# Patient Record
Sex: Male | Born: 1985 | Race: Black or African American | Hispanic: No | Marital: Single | Smoking: Current every day smoker
Health system: Southern US, Community
[De-identification: ages and names within clinical notes are randomized; demographics above are authoritative.]

---

## 2004-07-10 ENCOUNTER — Emergency Department: Payer: Self-pay | Admitting: Emergency Medicine

## 2004-07-19 ENCOUNTER — Emergency Department: Payer: Self-pay | Admitting: Emergency Medicine

## 2008-07-12 ENCOUNTER — Emergency Department: Payer: Self-pay | Admitting: Emergency Medicine

## 2008-08-30 ENCOUNTER — Emergency Department: Payer: Self-pay | Admitting: Emergency Medicine

## 2010-01-20 ENCOUNTER — Emergency Department: Payer: Self-pay | Admitting: Emergency Medicine

## 2010-03-03 ENCOUNTER — Emergency Department: Payer: Self-pay | Admitting: Emergency Medicine

## 2010-03-03 IMAGING — CR DG CHEST 2V
1 series · 3 of 3 positions shown · non-contrast
Comparison: none

REASON FOR EXAM: chest pain
COMMENTS:   LMP: (Male)

PROCEDURE:     DXR - DXR CHEST PA (OR AP) AND LATERAL  - [DATE] [DATE]
RESULT:     The lungs are clear. The cardiac silhouette and visualized bony
skeleton are unremarkable.

[Series 1: view not recorded · 0.17mm/px · 3 of 3 slices shown]
[im 1/3]
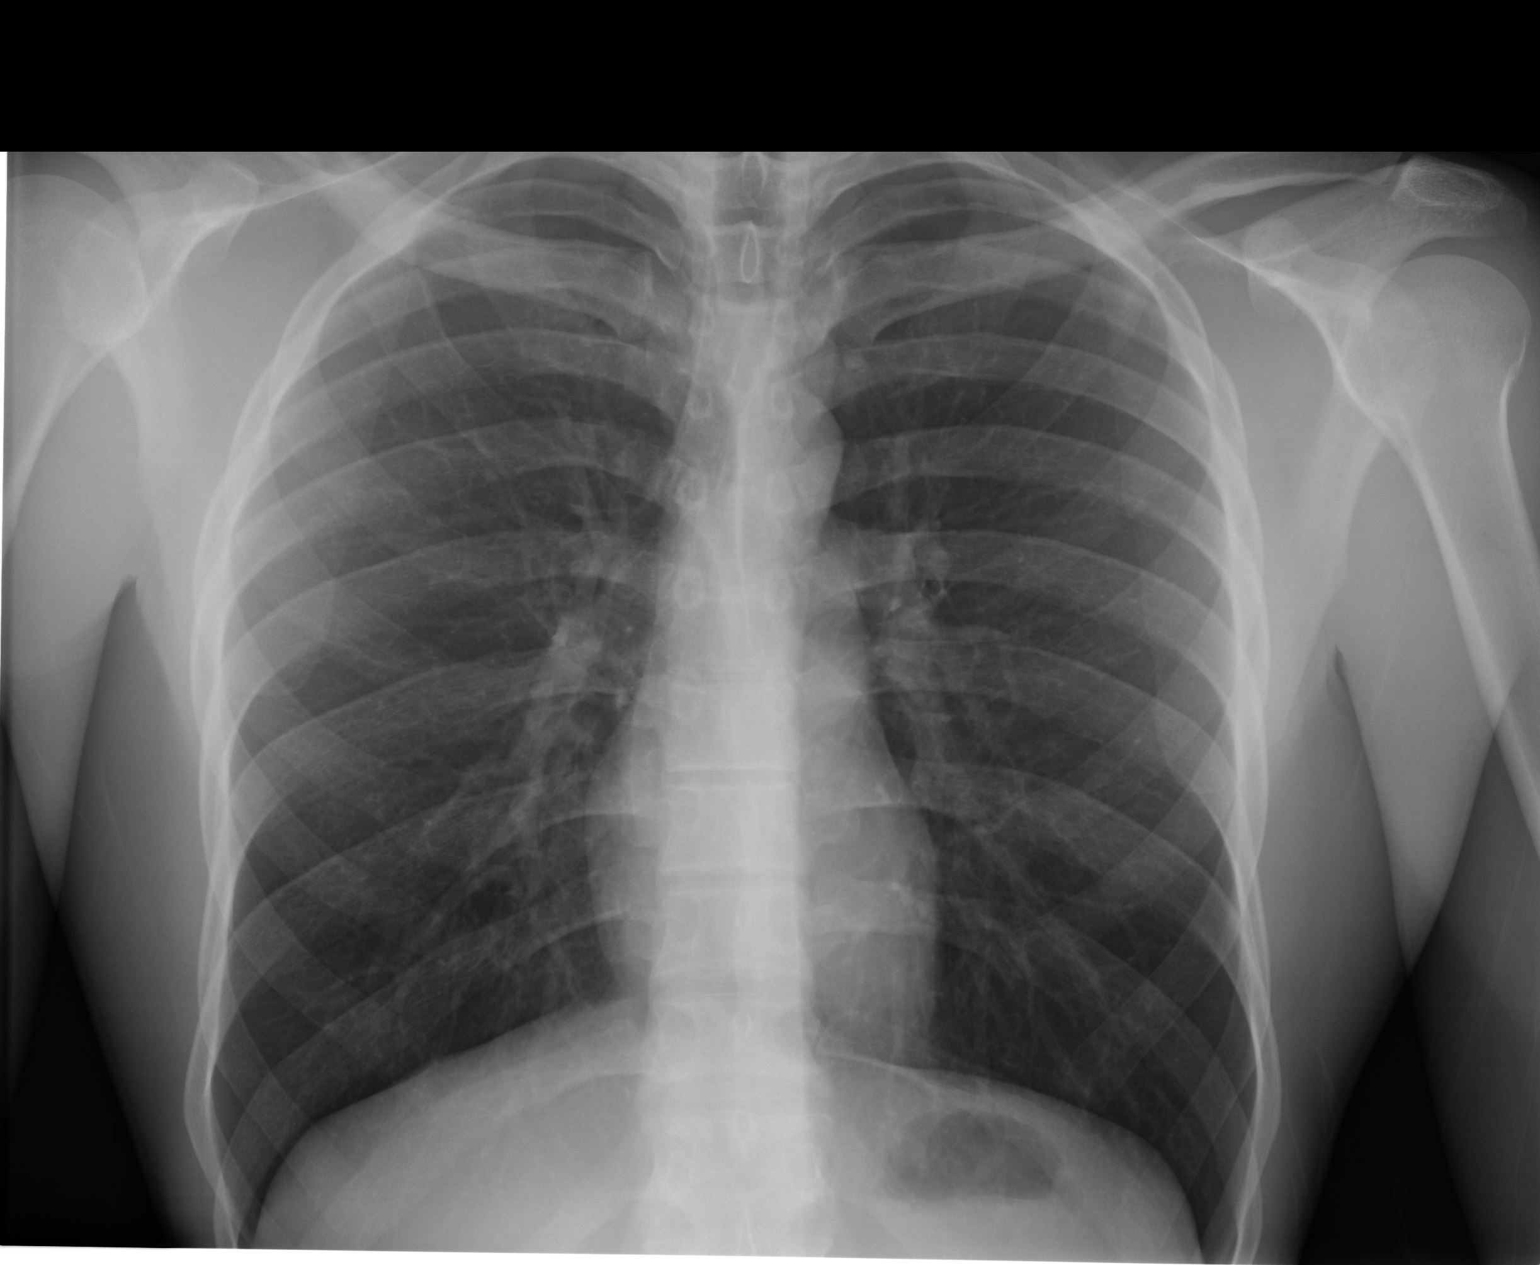
[im 2/3]
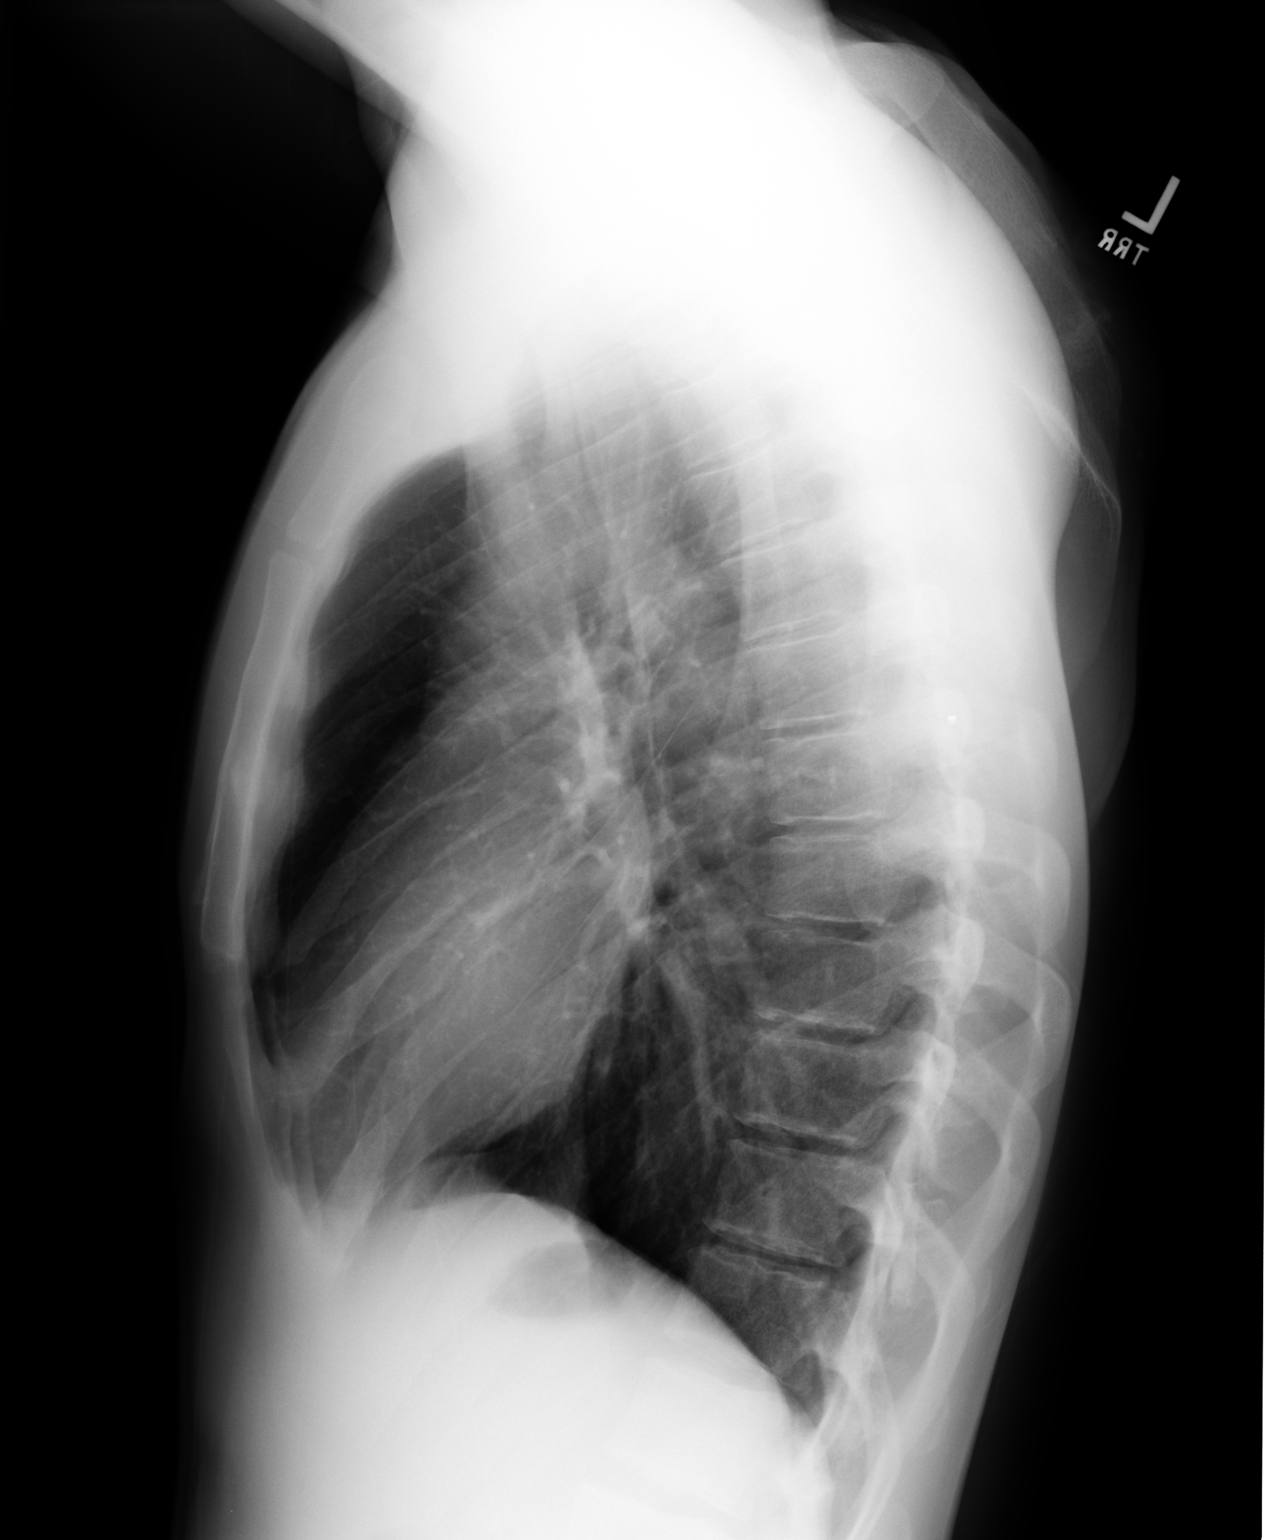
[im 3/3]
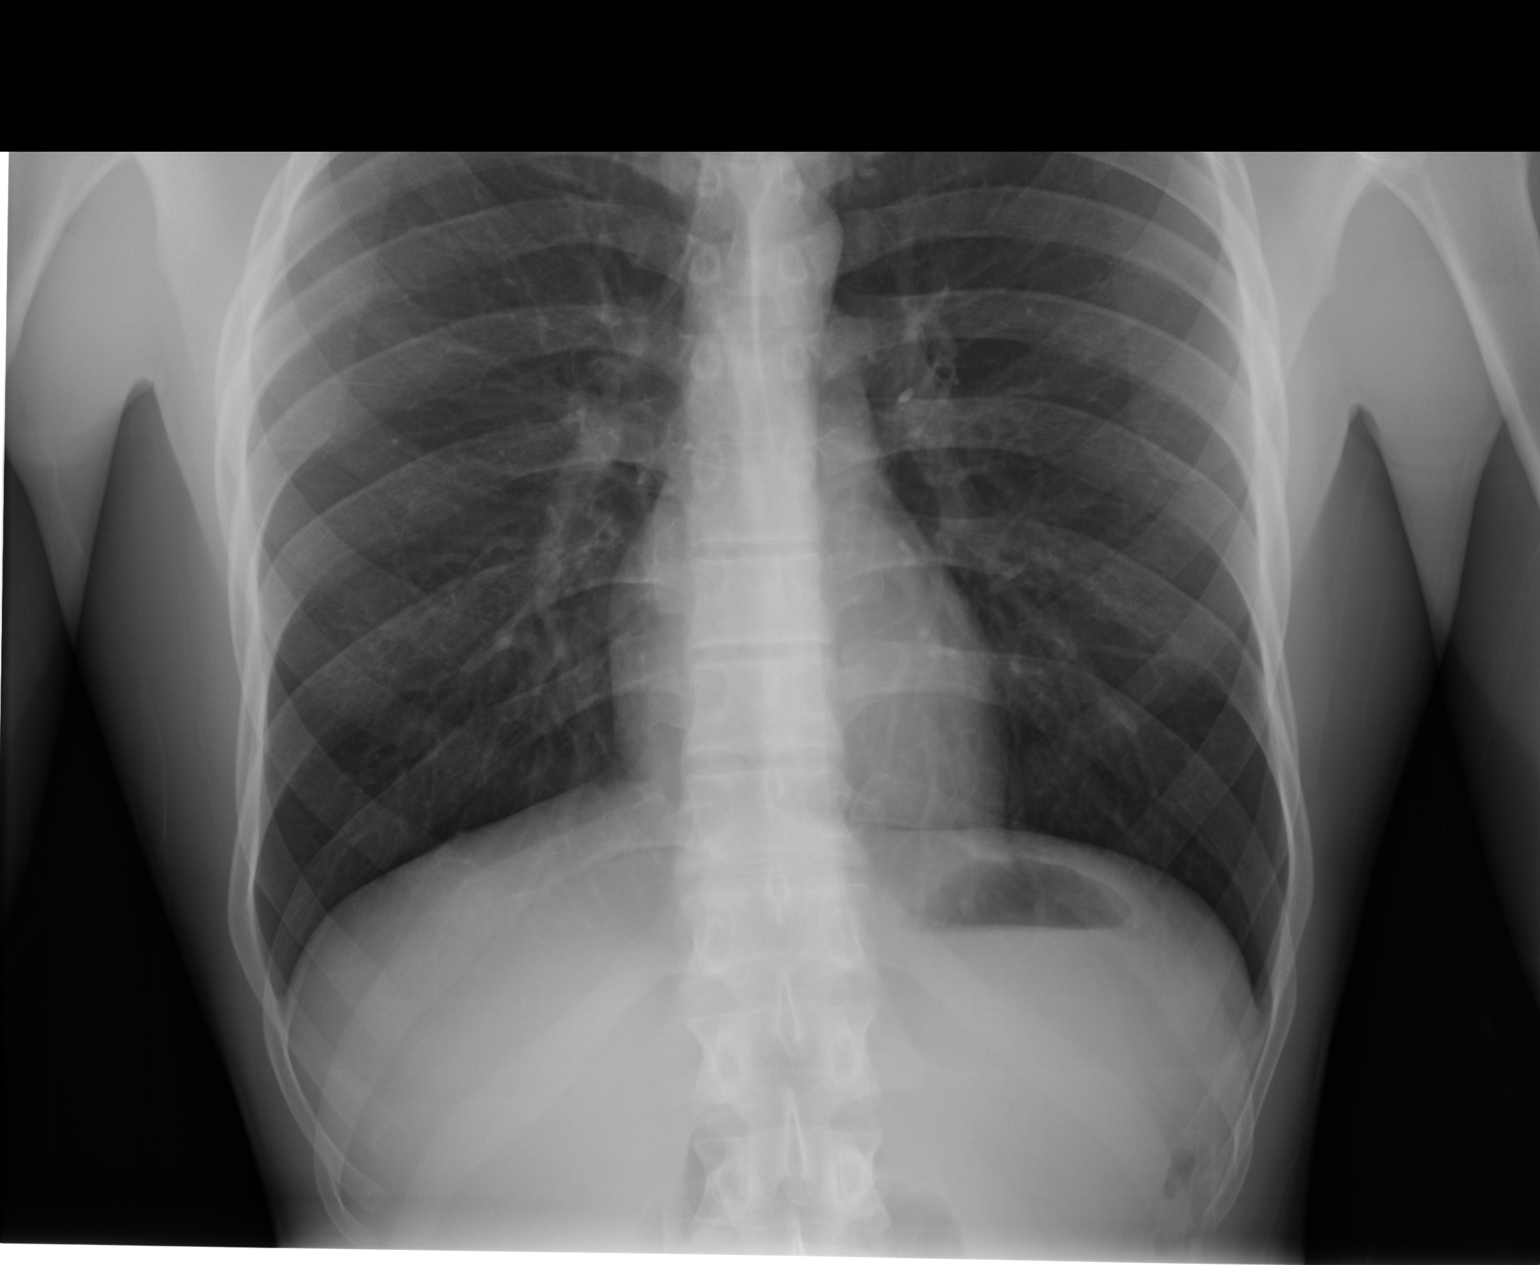

[3 of 3 positions shown; findings below may reference images not displayed]

IMPRESSION: 1. Chest radiograph without evidence of acute cardiopulmonary disease.

## 2010-08-12 ENCOUNTER — Emergency Department: Payer: Self-pay | Admitting: Emergency Medicine

## 2010-09-21 ENCOUNTER — Emergency Department: Payer: Self-pay | Admitting: Emergency Medicine

## 2011-04-01 ENCOUNTER — Emergency Department: Payer: Self-pay | Admitting: Emergency Medicine

## 2011-04-01 LAB — COMPREHENSIVE METABOLIC PANEL
Albumin: 4.7 g/dL (ref 3.4–5.0)
Alkaline Phosphatase: 50 U/L (ref 50–136)
BUN: 12 mg/dL (ref 7–18)
Bilirubin,Total: 0.6 mg/dL (ref 0.2–1.0)
Glucose: 81 mg/dL (ref 65–99)
Osmolality: 280 (ref 275–301)
SGPT (ALT): 28 U/L
Sodium: 141 mmol/L (ref 136–145)
Total Protein: 8.7 g/dL — ABNORMAL HIGH (ref 6.4–8.2)

## 2011-04-01 LAB — CBC
HGB: 16.2 g/dL (ref 13.0–18.0)
MCH: 30.1 pg (ref 26.0–34.0)
MCV: 90 fL (ref 80–100)
Platelet: 178 10*3/uL (ref 150–440)
RBC: 5.37 10*6/uL (ref 4.40–5.90)

## 2011-04-01 LAB — LIPASE, BLOOD: Lipase: 52 U/L — ABNORMAL LOW (ref 73–393)

## 2011-09-16 ENCOUNTER — Emergency Department: Payer: Self-pay | Admitting: *Deleted

## 2012-11-19 ENCOUNTER — Emergency Department: Payer: Self-pay | Admitting: Emergency Medicine

## 2012-12-17 ENCOUNTER — Emergency Department: Payer: Self-pay | Admitting: Emergency Medicine

## 2012-12-17 LAB — CBC
HCT: 46.5 % (ref 40.0–52.0)
HGB: 15.7 g/dL (ref 13.0–18.0)
MCH: 29.3 pg (ref 26.0–34.0)

## 2012-12-17 LAB — URINALYSIS, COMPLETE
Bilirubin,UR: NEGATIVE
Blood: NEGATIVE
Ph: 7 (ref 4.5–8.0)
Protein: 30
RBC,UR: 1 /HPF (ref 0–5)
Specific Gravity: 1.021 (ref 1.003–1.030)

## 2012-12-17 LAB — COMPREHENSIVE METABOLIC PANEL
Alkaline Phosphatase: 71 U/L (ref 50–136)
Bilirubin,Total: 0.4 mg/dL (ref 0.2–1.0)
Calcium, Total: 9.8 mg/dL (ref 8.5–10.1)
Chloride: 108 mmol/L — ABNORMAL HIGH (ref 98–107)
Co2: 26 mmol/L (ref 21–32)
Creatinine: 1.52 mg/dL — ABNORMAL HIGH (ref 0.60–1.30)
EGFR (Non-African Amer.): >60
Glucose: 102 mg/dL — ABNORMAL HIGH (ref 65–99)
SGOT(AST): 32 U/L (ref 15–37)
SGPT (ALT): 31 U/L (ref 12–78)
Sodium: 139 mmol/L (ref 136–145)

## 2012-12-17 LAB — LIPASE, BLOOD: Lipase: 80 U/L (ref 73–393)

## 2013-02-05 ENCOUNTER — Emergency Department: Payer: Self-pay | Admitting: Emergency Medicine

## 2013-03-26 ENCOUNTER — Emergency Department: Payer: Self-pay | Admitting: Emergency Medicine

## 2013-03-26 LAB — CBC WITH DIFFERENTIAL/PLATELET
BASOS ABS: 0 10*3/uL (ref 0.0–0.1)
Basophil %: 0.3 %
Eosinophil #: 0 10*3/uL (ref 0.0–0.7)
Eosinophil %: 0.1 %
HCT: 47.2 % (ref 40.0–52.0)
HGB: 15.9 g/dL (ref 13.0–18.0)
LYMPHS PCT: 16 %
Lymphocyte #: 1.9 10*3/uL (ref 1.0–3.6)
MCH: 29.7 pg (ref 26.0–34.0)
MCHC: 33.7 g/dL (ref 32.0–36.0)
MCV: 88 fL (ref 80–100)
Monocyte #: 1.2 x10 3/mm — ABNORMAL HIGH (ref 0.2–1.0)
Monocyte %: 9.8 %
NEUTROS ABS: 8.8 10*3/uL — AB (ref 1.4–6.5)
NEUTROS PCT: 73.8 %
Platelet: 201 10*3/uL (ref 150–440)
RBC: 5.35 10*6/uL (ref 4.40–5.90)
RDW: 14.4 % (ref 11.5–14.5)
WBC: 11.9 10*3/uL — AB (ref 3.8–10.6)

## 2013-03-26 LAB — TROPONIN I

## 2013-03-26 LAB — COMPREHENSIVE METABOLIC PANEL
Albumin: 4.2 g/dL (ref 3.4–5.0)
Alkaline Phosphatase: 55 U/L
Anion Gap: 5 — ABNORMAL LOW (ref 7–16)
BILIRUBIN TOTAL: 0.4 mg/dL (ref 0.2–1.0)
BUN: 17 mg/dL (ref 7–18)
Calcium, Total: 9.4 mg/dL (ref 8.5–10.1)
Chloride: 103 mmol/L (ref 98–107)
Co2: 26 mmol/L (ref 21–32)
Creatinine: 1.43 mg/dL — ABNORMAL HIGH (ref 0.60–1.30)
EGFR (Non-African Amer.): >60
GLUCOSE: 91 mg/dL (ref 65–99)
OSMOLALITY: 269 (ref 275–301)
Potassium: 3.8 mmol/L (ref 3.5–5.1)
SGOT(AST): 31 U/L (ref 15–37)
SGPT (ALT): 26 U/L (ref 12–78)
SODIUM: 134 mmol/L — AB (ref 136–145)
Total Protein: 8.4 g/dL — ABNORMAL HIGH (ref 6.4–8.2)

## 2013-03-26 LAB — DRUG SCREEN, URINE
Amphetamines, Ur Screen: NEGATIVE (ref ?–1000)
BARBITURATES, UR SCREEN: NEGATIVE (ref ?–200)
Benzodiazepine, Ur Scrn: NEGATIVE (ref ?–200)
Cannabinoid 50 Ng, Ur ~~LOC~~: POSITIVE (ref ?–50)
Cocaine Metabolite,Ur ~~LOC~~: POSITIVE (ref ?–300)
MDMA (Ecstasy)Ur Screen: NEGATIVE (ref ?–500)
Methadone, Ur Screen: NEGATIVE (ref ?–300)
Opiate, Ur Screen: NEGATIVE (ref ?–300)
PHENCYCLIDINE (PCP) UR S: NEGATIVE (ref ?–25)
Tricyclic, Ur Screen: NEGATIVE (ref ?–1000)

## 2013-03-26 LAB — URINALYSIS, COMPLETE
Bacteria: NONE SEEN
Glucose,UR: NEGATIVE mg/dL (ref 0–75)
LEUKOCYTE ESTERASE: NEGATIVE
Nitrite: NEGATIVE
PH: 5 (ref 4.5–8.0)
Protein: 100
RBC,UR: 13 /HPF (ref 0–5)
SQUAMOUS EPITHELIAL: NONE SEEN
Specific Gravity: 1.034 (ref 1.003–1.030)
WBC UR: 3 /HPF (ref 0–5)

## 2013-03-26 LAB — LIPASE, BLOOD: Lipase: 86 U/L (ref 73–393)

## 2016-07-10 ENCOUNTER — Emergency Department
Admission: EM | Admit: 2016-07-10 | Discharge: 2016-07-10 | Disposition: A | Discharge status: Home / Self Care | Payer: 59 | Attending: Student in an Organized Health Care Education/Training Program | Admitting: Student in an Organized Health Care Education/Training Program

## 2016-07-10 ENCOUNTER — Encounter: Payer: Self-pay | Admitting: Emergency Medicine

## 2016-07-10 DIAGNOSIS — R21 Rash and other nonspecific skin eruption: Secondary | ICD-10-CM | POA: Diagnosis present

## 2016-07-10 DIAGNOSIS — T782XXA Anaphylactic shock, unspecified, initial encounter: Secondary | ICD-10-CM | POA: Diagnosis not present

## 2016-07-10 DIAGNOSIS — F17210 Nicotine dependence, cigarettes, uncomplicated: Secondary | ICD-10-CM | POA: Diagnosis not present

## 2016-07-10 MED ORDER — EPINEPHRINE 0.3 MG/0.3ML IJ SOAJ
INTRAMUSCULAR | Status: AC
  Administered 2016-07-10: 0.3 mg via INTRAMUSCULAR
  Filled 2016-07-10: qty 0.3

## 2016-07-10 MED ORDER — DIPHENHYDRAMINE HCL 50 MG/ML IJ SOLN
50.0000 mg | Freq: Once | INTRAMUSCULAR | Status: AC
Start: 2016-07-10 — End: 2016-07-10
  Administered 2016-07-10: 50 mg via INTRAVENOUS
  Filled 2016-07-10: qty 1

## 2016-07-10 MED ORDER — METHYLPREDNISOLONE SODIUM SUCC 125 MG IJ SOLR
125.0000 mg | Freq: Once | INTRAMUSCULAR | Status: AC
  Administered 2016-07-10: 125 mg via INTRAVENOUS
  Filled 2016-07-10: qty 2

## 2016-07-10 MED ORDER — EPINEPHRINE 0.3 MG/0.3ML IJ SOAJ
0.3000 mg | Freq: Once | INTRAMUSCULAR | Status: AC
  Administered 2016-07-10: 0.3 mg via INTRAMUSCULAR

## 2016-07-10 MED ORDER — SODIUM CHLORIDE 0.9 % IV BOLUS (SEPSIS)
500.0000 mL | Freq: Once | INTRAVENOUS | Status: AC
  Administered 2016-07-10: 500 mL via INTRAVENOUS

## 2016-07-10 MED ORDER — PREDNISONE 20 MG PO TABS: 40.0000 mg | ORAL_TABLET | Freq: Every day | ORAL | 0 refills | Status: AC

## 2016-07-10 MED ORDER — EPINEPHRINE 0.3 MG/0.3ML IJ SOAJ: 0.3000 mg | Freq: Once | INTRAMUSCULAR | 0 refills | Status: AC

## 2016-07-10 MED ORDER — FAMOTIDINE IN NACL 20-0.9 MG/50ML-% IV SOLN
20.0000 mg | Freq: Once | INTRAVENOUS | Status: AC
  Administered 2016-07-10: 20 mg via INTRAVENOUS
  Filled 2016-07-10: qty 50

## 2016-07-10 NOTE — ED Triage Notes (Signed)
Patient presents to the ED with hives, itching and redness to face, torso, arms, and legs.  Patient also has some swelling to his lips.  Patient states he found a red ant on his scrotum prior to itching starting.  Patient appears uncomfortable in triage.  Denies any difficulty talking or swallowing or breathing.  Tongue appears slightly swollen.

## 2016-07-10 NOTE — ED Provider Notes (Signed)
Casey Casey [name] Provider Note    First MD Initiated Contact with Casey [name] 07/10/16 1647     (approximate)  I have reviewed the triage vital signs and the nursing notes.   HISTORY  Chief Complaint Allergic Reaction    HPI Casey Redmanuel Tripodi Jr. is a 31 y.o. male sensitivity complaint of diffuse itching rash eruption with swelling of his lips and left side of his tongue that occurred roughly 2 hours prior to arrival. Casey [name] states he was at work and felt something bite of the left side of his testicle. He looked down and found a small red ant. He smashed and threw it out but started breaking out in diffuse hives. Denies any history of allergic reaction   History reviewed. No pertinent past medical history. FMH: no bleeding disorders History reviewed. No pertinent surgical history. There are no active problems to display for this Casey [name].     Prior to Admission medications   Not on File    Allergies Casey [name] has no known allergies.    Social History Social History  Substance Use Topics  . Smoking status: Current Every Day Smoker    Packs/day: 1.00    Types: Cigarettes  . Smokeless tobacco: Never Used  . Alcohol use No    Review of Systems Casey [name] denies headaches, rhinorrhea, blurry vision, numbness, shortness of breath, chest pain, edema, cough, abdominal pain, nausea, vomiting, diarrhea, dysuria, fevers, rashes or hallucinations unless otherwise stated above in HPI. ____________________________________________   PHYSICAL EXAM:  VITAL SIGNS: Vitals:   07/10/16 1634  BP: (!) 127/99  Resp: 18    Constitutional: Alert and oriented. Well appearing and in no acute distress. Eyes: Conjunctivae are normal. PERRL. EOMI. Head: Atraumatic. Nose: No congestion/rhinnorhea. Mouth/Throat: Mucous membranes are moist.  Oropharynx non-erythematous.  + left sided tongue swelling, + uvular edema, normal phonation, urticaria surrounding  lips without significant angioedema Neck: No stridor. Painless ROM. No cervical spine tenderness to palpation Hematological/Lymphatic/Immunilogical: No cervical lymphadenopathy. Cardiovascular: Normal rate, regular rhythm. Grossly normal heart sounds.  Good peripheral circulation. Respiratory: Normal respiratory effort.  No retractions. Lungs CTAB. Gastrointestinal: Soft and nontender. No distention. No abdominal bruits. No CVA tenderness. Musculoskeletal: No lower extremity tenderness nor edema.  No joint effusions. Neurologic:  Normal speech and language. No gross focal neurologic deficits are appreciated. No gait instability. Skin:  Diffuse urtial erruptions involing BUE and BLE Psychiatric: Mood and affect are normal. Speech and behavior are normal.  ____________________________________________   LABS (all labs ordered are listed, but only abnormal results are displayed)  No results found for this or any previous visit (from the past 24 hour(s)). ____________________________________________ ___________________________________________   PROCEDURES  Procedure(s) performed:  Procedures    Critical Care performed: no ____________________________________________   INITIAL IMPRESSION / ASSESSMENT AND PLAN / ED COURSE  Pertinent labs & imaging results that were available during my care of the Casey [name] were reviewed by me and considered in my medical decision making (see chart for details).  DDX: anaphylaxis, allergic reaction, urticaria, angioedema  Casey Redmanuel Elahi Jr. is a 31 y.o. who presents to the ED with allergic reaction with face swelling and tongue swelling as described above. Casey [name] otherwise well-appearing and without any respiratory distress but clinical findings are consistent with anaphylaxis. Casey [name] will be given IM epinephrine, steroids, Benadryl and Pepcid.  Will continue to monitor.  Clinical Course as of Jul 11 1934  Tue Jul 10, 2016  1721 Casey [name] reassessed.  Feels that the itching has improved. No worsening  swelling of his tongue or lips.  [PR]  1907 Casey [name] reassessed. Tongue swelling is resolved. No uvular edema. No urticaria. Resting comfortably.  [PR]    Clinical Course User Index [PR] Willy Eddy, MD     ____________________________________________   FINAL CLINICAL IMPRESSION(S) / ED DIAGNOSES  Final diagnoses:  Anaphylaxis, initial encounter      NEW MEDICATIONS STARTED DURING THIS VISIT:  New Prescriptions   No medications on file     Note:  This document was prepared using Dragon voice recognition software and may include unintentional dictation errors.    Willy Eddy, MD 07/10/16 [phone]

## 2016-07-10 NOTE — ED Notes (Signed)
FN: pt got bit by red ant around 130pm today and now having facial swelling and itching. Pt states got bit in his private area. Pt noted to have redness to face an neck with some swelling to lips. No resp distress noted.

## 2018-12-29 ENCOUNTER — Other Ambulatory Visit: Payer: Self-pay

## 2018-12-29 DIAGNOSIS — S90851A Superficial foreign body, right foot, initial encounter: Secondary | ICD-10-CM | POA: Insufficient documentation

## 2018-12-29 DIAGNOSIS — S80862A Insect bite (nonvenomous), left lower leg, initial encounter: Secondary | ICD-10-CM | POA: Insufficient documentation

## 2018-12-29 DIAGNOSIS — S40862A Insect bite (nonvenomous) of left upper arm, initial encounter: Secondary | ICD-10-CM | POA: Insufficient documentation

## 2018-12-29 DIAGNOSIS — Y999 Unspecified external cause status: Secondary | ICD-10-CM | POA: Insufficient documentation

## 2018-12-29 DIAGNOSIS — Y939 Activity, unspecified: Secondary | ICD-10-CM | POA: Insufficient documentation

## 2018-12-29 DIAGNOSIS — W57XXXA Bitten or stung by nonvenomous insect and other nonvenomous arthropods, initial encounter: Secondary | ICD-10-CM | POA: Insufficient documentation

## 2018-12-29 DIAGNOSIS — F17210 Nicotine dependence, cigarettes, uncomplicated: Secondary | ICD-10-CM | POA: Insufficient documentation

## 2018-12-29 DIAGNOSIS — Y929 Unspecified place or not applicable: Secondary | ICD-10-CM | POA: Insufficient documentation

## 2018-12-29 DIAGNOSIS — S90562A Insect bite (nonvenomous), left ankle, initial encounter: Secondary | ICD-10-CM | POA: Insufficient documentation

## 2018-12-29 NOTE — ED Triage Notes (Signed)
Pt to the er for an allergic reaction to fire ants. Areas of itching are located on the left ankle and left arm. Bites occurred on Saturday. Pt states the itching has not improved.

## 2018-12-30 ENCOUNTER — Emergency Department
Admission: EM | Admit: 2018-12-30 | Discharge: 2018-12-30 | Disposition: A | Discharge status: Home / Self Care | Payer: 59 | Attending: Emergency Medicine | Admitting: Emergency Medicine

## 2018-12-30 DIAGNOSIS — S90851A Superficial foreign body, right foot, initial encounter: Secondary | ICD-10-CM

## 2018-12-30 DIAGNOSIS — W57XXXA Bitten or stung by nonvenomous insect and other nonvenomous arthropods, initial encounter: Secondary | ICD-10-CM

## 2018-12-30 MED ORDER — HYDROXYZINE HCL 25 MG PO TABS
25.0000 mg | ORAL_TABLET | Freq: Once | ORAL | Status: AC
  Administered 2018-12-30: 25 mg via ORAL
  Filled 2018-12-30: qty 1

## 2018-12-30 MED ORDER — DEXAMETHASONE SODIUM PHOSPHATE 10 MG/ML IJ SOLN
10.0000 mg | Freq: Once | INTRAMUSCULAR | Status: AC
Start: 2018-12-30 — End: 2018-12-30
  Administered 2018-12-30: 10 mg via INTRAMUSCULAR
  Filled 2018-12-30: qty 1

## 2018-12-30 MED ORDER — HYDROXYZINE HCL 25 MG PO TABS: 25.0000 mg | ORAL_TABLET | Freq: Three times a day (TID) | ORAL | 0 refills | Status: DC | PRN

## 2018-12-30 NOTE — ED Provider Notes (Signed)
[facility] Emergency Department Provider Note  ____________________________________________  Time seen: Approximately 12:42 AM  I have reviewed the triage vital signs and the nursing notes.   HISTORY  Chief Complaint Allergic Reaction   HPI Casey [name]. is a 33 y.o. male presenting to the emergency department for treatment and evaluation of itching secondary to fire ant bites.  He states that 2 days ago, he noticed the ants crawling on his left leg and started smacking at them.  He then also sustained some bites to the left upper arm.  He has taken some Benadryl without much relief.  Patient also states that he has a piece of glass embedded in the sole of his right foot that has been there for "a long time."  He would like to know what to do about that.   History reviewed. No pertinent past medical history.  There are no active problems to display for this patient.   History reviewed. No pertinent surgical history.  Prior to Admission medications   Medication Sig Start Date End Date Taking? Authorizing Provider  hydrOXYzine (ATARAX/VISTARIL) 25 MG tablet Take 1 tablet (25 mg total) by mouth 3 (three) times daily as needed. 12/30/18   Chinita Pester, FNP    Allergies Patient has no known allergies.  No family history on file.  Social History Social History   Tobacco Use  . Smoking status: Current Every Day Smoker    Packs/day: 1.00    Types: Cigarettes  . Smokeless tobacco: Never Used  Substance Use Topics  . Alcohol use: No  . Drug use: No    Review of Systems  Constitutional: Negative for fever. Respiratory: Negative for cough or shortness of breath.  Musculoskeletal: Negative for myalgias Skin: Positive for insect bites Neurological: Negative for numbness or paresthesias. ____________________________________________   PHYSICAL EXAM:  VITAL SIGNS: ED Triage Vitals  Enc Vitals Group     BP --      Pulse --      Resp --     Temp --      Temp src --      SpO2 --      Weight 12/29/18 2340 150 lb (68 kg)     Height 12/29/18 2340 5\' 11"  (1.803 m)     Head Circumference --      Peak Flow --      Pain Score 12/29/18 2339 0     Pain Loc --      Pain Edu? --      Excl. in GC? --      Constitutional: Well appearing. Eyes: Conjunctivae are clear without discharge or drainage. Nose: No rhinorrhea noted. Mouth/Throat: Airway is patent.  Neck: No stridor. Unrestricted range of motion observed. Cardiovascular: Capillary refill is <3 seconds.  Respiratory: Respirations are even and unlabored.. Musculoskeletal: Unrestricted range of motion observed. Neurologic: Awake, alert, and oriented x 4.  Skin: Scattered, macular, erythematous and excoriated areas to the left ankle, left upper leg, and left upper extremity.  No secondary infection noted.  Right plantar surface with a palpable, mobile, foreign body without any open wounds or lesions.  ____________________________________________   LABS (all labs ordered are listed, but only abnormal results are displayed)  Labs Reviewed - No data to display ____________________________________________  EKG  Not indicated. ____________________________________________  RADIOLOGY  Not indicated ____________________________________________   PROCEDURES  Procedures ____________________________________________   INITIAL IMPRESSION / ASSESSMENT AND PLAN / ED COURSE  Casey [name]. is a 33 y.o.  male presenting to the emergency department for treatment and evaluation after having fire ants bite him 2 days ago.  He would also like to know what to do about a retained foreign body in the right foot.  Exam is reassuring.  No appearance of cellulitis or secondary infection to the areas of excoriation due to fire ant bites.  While here, he was given an injection of Decadron and will receive prescription for Atarax.  For the retained foreign body in the foot, he was  referred to podiatry for evaluation.  Patient is to return to the emergency department for symptoms of change or worsen if he is unable to schedule an appointment with primary care.   Medications  dexamethasone (DECADRON) injection 10 mg (10 mg Intramuscular Given 12/30/18 0041)  hydrOXYzine (ATARAX/VISTARIL) tablet 25 mg (25 mg Oral Given 12/30/18 0041)     Pertinent labs & imaging results that were available during my care of the patient were reviewed by me and considered in my medical decision making (see chart for details).  ____________________________________________   FINAL CLINICAL IMPRESSION(S) / ED DIAGNOSES  Final diagnoses:  Insect bites and stings, initial encounter  Foreign body of skin of plantar aspect of right foot    ED Discharge Orders         Ordered    hydrOXYzine (ATARAX/VISTARIL) 25 MG tablet  3 times daily PRN     12/30/18 0026           Note:  This document was prepared using Dragon voice recognition software and may include unintentional dictation errors.   Victorino Dike, FNP 12/30/18 5809    Nena Polio, MD 12/30/18 [phone]

## 2018-12-30 NOTE — Discharge Instructions (Signed)
Take the atarax as prescribed as needed for itching.  Follow up with podiatry for evaluation and potential removal of glass from your foot.

## 2019-04-20 ENCOUNTER — Ambulatory Visit (INDEPENDENT_AMBULATORY_CARE_PROVIDER_SITE_OTHER): Payer: Self-pay | Admitting: Podiatry

## 2019-04-20 ENCOUNTER — Ambulatory Visit (INDEPENDENT_AMBULATORY_CARE_PROVIDER_SITE_OTHER): Payer: Self-pay

## 2019-04-20 ENCOUNTER — Encounter: Payer: Self-pay | Admitting: Podiatry

## 2019-04-20 ENCOUNTER — Other Ambulatory Visit: Payer: Self-pay

## 2019-04-20 DIAGNOSIS — M79671 Pain in right foot: Secondary | ICD-10-CM

## 2019-04-20 DIAGNOSIS — S90851A Superficial foreign body, right foot, initial encounter: Secondary | ICD-10-CM

## 2019-04-22 ENCOUNTER — Encounter: Payer: Self-pay | Admitting: Podiatry
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
## 2019-06-05 ENCOUNTER — Other Ambulatory Visit: Payer: Self-pay

## 2019-06-05 ENCOUNTER — Ambulatory Visit: Payer: Self-pay | Admitting: Physician Assistant

## 2019-06-05 DIAGNOSIS — A5401 Gonococcal cystitis and urethritis, unspecified: Secondary | ICD-10-CM

## 2019-06-05 DIAGNOSIS — Z113 Encounter for screening for infections with a predominantly sexual mode of transmission: Secondary | ICD-10-CM

## 2019-06-05 LAB — GRAM STAIN

## 2019-06-05 MED ORDER — DOXYCYCLINE HYCLATE 100 MG PO TABS: 100.0000 mg | ORAL_TABLET | Freq: Two times a day (BID) | ORAL | 0 refills | Status: AC

## 2019-06-05 MED ORDER — CEFTRIAXONE SODIUM 250 MG IJ SOLR
500.0000 mg | Freq: Once | INTRAMUSCULAR | Status: AC
  Administered 2019-06-05: 500 mg via INTRAMUSCULAR

## 2019-06-05 NOTE — Progress Notes (Signed)
Gram stain reviewed, pt treated for GC per provider orders. Provider orders completed. 

## 2019-06-08 ENCOUNTER — Encounter: Payer: Self-pay | Admitting: Physician Assistant

## 2019-06-08 NOTE — Progress Notes (Signed)
   [facility] STI clinic/screening visit  Subjective:  Casey [name]. is a 34 y.o. male being seen today for an STI screening visit. The patient reports they do have symptoms.    Patient has the following medical conditions:  There are no problems to display for this patient.    Chief Complaint  Patient presents with  . SEXUALLY TRANSMITTED DISEASE    STD screening (no bloodwork)    HPI  Patient reports that he has had clear/white discharge for 2 days and dysuria for about 5 days.  Denies any other symptoms.   See flowsheet for further details and programmatic requirements.    The following portions of the patient's history were reviewed and updated as appropriate: allergies, current medications, past medical history, past social history, past surgical history and problem list.  Objective:  There were no vitals filed for this visit.  Physical Exam Constitutional:      General: He is not in acute distress.    Appearance: Normal appearance.  HENT:     Head: Normocephalic and atraumatic.     Comments: No nits, lice or hair loss. No cervical, supraclavicular or axillary adenopathy.    Mouth/Throat:     Mouth: Mucous membranes are moist.     Pharynx: Oropharynx is clear. No oropharyngeal exudate or posterior oropharyngeal erythema.  Eyes:     Conjunctiva/sclera: Conjunctivae normal.  Pulmonary:     Effort: Pulmonary effort is normal.  Abdominal:     Palpations: Abdomen is soft. There is no mass.     Tenderness: There is no abdominal tenderness. There is no guarding or rebound.  Genitourinary:    Penis: Normal.      Testes: Normal.     Comments: Pubic area without nits, lice, edema, erythema, or inguinal adenopathy. Penis circumcised, without rash or lesions. Small amount of whitish discharge at meatus. Musculoskeletal:     Cervical back: Neck supple. No tenderness.  Skin:    General: Skin is warm and dry.     Findings: No bruising,  erythema, lesion or rash.  Neurological:     Mental Status: He is alert and oriented to person, place, and time.  Psychiatric:        Mood and Affect: Mood normal.        Behavior: Behavior normal.        Thought Content: Thought content normal.        Judgment: Judgment normal.       Assessment and Plan:  Sian Joles. is a 34 y.o. male presenting to the [facility] for STI screening  1. Screening for STD (sexually transmitted disease) Patient into clinic with symptoms.  Declines blood work today. Rec condoms with all sex. - Gram stain  2. Gonococcal urethritis in male Treat for GC and to cover for Chlamydia with Ceftriaxone 500mg  IM and Doxycycline 100mg  #14 1 po BID for 7 days. No sex for 14 days and until after partner completes treatment. Call with any questions or concerns. - cefTRIAXone (ROCEPHIN) injection 500 mg - doxycycline (VIBRA-TABS) 100 MG tablet; Take 1 tablet (100 mg total) by mouth 2 (two) times daily for 7 days.  Dispense: 14 tablet; Refill: 0     Return in about 3 months (around 09/04/2019) for TOC.  No future appointments.  , PA

## 2019-07-05 ENCOUNTER — Emergency Department: Payer: Self-pay

## 2019-07-05 ENCOUNTER — Encounter: Payer: Self-pay | Admitting: Radiology

## 2019-07-05 ENCOUNTER — Emergency Department
Admission: EM | Admit: 2019-07-05 | Discharge: 2019-07-05 | Disposition: A | Discharge status: Home / Self Care | Payer: Self-pay | Attending: Emergency Medicine | Admitting: Emergency Medicine

## 2019-07-05 ENCOUNTER — Other Ambulatory Visit: Payer: Self-pay

## 2019-07-05 DIAGNOSIS — R112 Nausea with vomiting, unspecified: Secondary | ICD-10-CM

## 2019-07-05 DIAGNOSIS — F17210 Nicotine dependence, cigarettes, uncomplicated: Secondary | ICD-10-CM | POA: Insufficient documentation

## 2019-07-05 DIAGNOSIS — A059 Bacterial foodborne intoxication, unspecified: Secondary | ICD-10-CM | POA: Insufficient documentation

## 2019-07-05 DIAGNOSIS — Z20822 Contact with and (suspected) exposure to covid-19: Secondary | ICD-10-CM | POA: Insufficient documentation

## 2019-07-05 DIAGNOSIS — R1013 Epigastric pain: Secondary | ICD-10-CM | POA: Insufficient documentation

## 2019-07-05 LAB — URINALYSIS, COMPLETE (UACMP) WITH MICROSCOPIC
Bacteria, UA: NONE SEEN
Bilirubin Urine: NEGATIVE
Glucose, UA: NEGATIVE mg/dL
Hgb urine dipstick: NEGATIVE
Ketones, ur: 20 mg/dL — AB
Leukocytes,Ua: NEGATIVE
Nitrite: NEGATIVE
Protein, ur: NEGATIVE mg/dL
Specific Gravity, Urine: 1.04 — ABNORMAL HIGH (ref 1.005–1.030)
Squamous Epithelial / LPF: NONE SEEN (ref 0–5)
pH: 7 (ref 5.0–8.0)

## 2019-07-05 LAB — COMPREHENSIVE METABOLIC PANEL
ALT: 17 U/L (ref 0–44)
AST: 24 U/L (ref 15–41)
Albumin: 4.6 g/dL (ref 3.5–5.0)
Alkaline Phosphatase: 63 U/L (ref 38–126)
Anion gap: 12 (ref 5–15)
BUN: 12 mg/dL (ref 6–20)
CO2: 26 mmol/L (ref 22–32)
Calcium: 9.4 mg/dL (ref 8.9–10.3)
Chloride: 105 mmol/L (ref 98–111)
Creatinine, Ser: 1.19 mg/dL (ref 0.61–1.24)
GFR calc Af Amer: >60 mL/min (ref >60)
GFR calc non Af Amer: >60 mL/min (ref >60)
Glucose, Bld: 87 mg/dL (ref 70–99)
Potassium: 4 mmol/L (ref 3.5–5.1)
Sodium: 143 mmol/L (ref 135–145)
Total Bilirubin: 0.7 mg/dL (ref 0.3–1.2)
Total Protein: 8.2 g/dL — ABNORMAL HIGH (ref 6.5–8.1)

## 2019-07-05 LAB — CBC
HCT: 52.1 % — ABNORMAL HIGH (ref 39.0–52.0)
Hemoglobin: 17.2 g/dL — ABNORMAL HIGH (ref 13.0–17.0)
MCH: 29.9 pg (ref 26.0–34.0)
MCHC: 33 g/dL (ref 30.0–36.0)
MCV: 90.5 fL (ref 80.0–100.0)
Platelets: 217 10*3/uL (ref 150–400)
RBC: 5.76 MIL/uL (ref 4.22–5.81)
RDW: 14.2 % (ref 11.5–15.5)
WBC: 14.4 10*3/uL — ABNORMAL HIGH (ref 4.0–10.5)
nRBC: 0 % (ref 0.0–0.2)

## 2019-07-05 LAB — LIPASE, BLOOD: Lipase: 23 U/L (ref 11–51)

## 2019-07-05 LAB — SARS CORONAVIRUS 2 BY RT PCR (HOSPITAL ORDER, PERFORMED IN ~~LOC~~ HOSPITAL LAB): SARS Coronavirus 2: NEGATIVE

## 2019-07-05 MED ORDER — SODIUM CHLORIDE 0.9 % IV SOLN: Freq: Once | INTRAVENOUS | Status: AC

## 2019-07-05 MED ORDER — SODIUM CHLORIDE 0.9 % IV BOLUS
1000.0000 mL | Freq: Once | INTRAVENOUS | Status: AC
  Administered 2019-07-05: 1000 mL via INTRAVENOUS

## 2019-07-05 MED ORDER — SODIUM CHLORIDE 0.9% FLUSH: 3.0000 mL | Freq: Once | INTRAVENOUS | Status: DC

## 2019-07-05 MED ORDER — ONDANSETRON HCL 4 MG/2ML IJ SOLN
4.0000 mg | Freq: Once | INTRAMUSCULAR | Status: AC | PRN
  Administered 2019-07-05: 4 mg via INTRAVENOUS
  Filled 2019-07-05 (×2): qty 2

## 2019-07-05 MED ORDER — ONDANSETRON 4 MG PO TBDP: 4.0000 mg | ORAL_TABLET | Freq: Three times a day (TID) | ORAL | 0 refills | Status: AC | PRN

## 2019-07-05 MED ORDER — IOHEXOL 300 MG/ML  SOLN
100.0000 mL | Freq: Once | INTRAMUSCULAR | Status: AC | PRN
  Administered 2019-07-05: 100 mL via INTRAVENOUS

## 2019-07-05 MED ORDER — ONDANSETRON HCL 4 MG/2ML IJ SOLN
4.0000 mg | Freq: Once | INTRAMUSCULAR | Status: AC
  Administered 2019-07-05: 4 mg via INTRAVENOUS
  Filled 2019-07-05: qty 2

## 2019-07-05 NOTE — ED Notes (Signed)
Pt given urinal and instructed on use for UA

## 2019-07-05 NOTE — ED Notes (Signed)
Strong odor of marijuana noted in room . Pt resting in bed.

## 2019-07-05 NOTE — ED Notes (Signed)
Pt with CT. 

## 2019-07-05 NOTE — ED Provider Notes (Signed)
Casey [name]  ____________________________________________  Time seen: Approximately 12:08 PM  I have reviewed the triage vital signs and the nursing notes.   HISTORY  Chief Complaint Emesis    HPI Casey Colomb. is a 34 y.o. male who presents emergency department complaining of nausea, vomiting, epigastric abdominal pain, diarrhea, cough and shortness of breath.  Patient states that he has had a cough for "a few days."  Patient states that he was overall feeling well and ate cookout late last night.  Patient states that he had chili, hamburger and then went to bed.  Patient states that this morning he woke up with significant nausea.  He has been having vomiting since he awoke.  Patient states that initial vomiting resulted in him throwing up "everything I ate last night."  Patient states that since then he has been throwing up bile and "spit."  Patient denies any fevers or chills, nasal congestion, sore throat.  Patient does have a cough, mild shortness of breath, epigastric abdominal pain.  No lower abdominal pain.  No urinary complaints.  No medications prior to arrival.      During interview, patient coughing and intermittently vomiting.  Patient has had antiemetics and is still having repetitive bilious vomit.   History reviewed. No pertinent past medical history.  There are no problems to display for this patient.   No past surgical history on file.  Prior to Admission medications   Medication Sig Start Date End Date Taking? Authorizing Provider  hydrOXYzine (ATARAX/VISTARIL) 25 MG tablet Take 1 tablet (25 mg total) by mouth 3 (three) times daily as needed. 12/30/18   Triplett, Rulon Eisenmenger B, FNP  ondansetron (ZOFRAN-ODT) 4 MG disintegrating tablet Take 1 tablet (4 mg total) by mouth every 8 (eight) hours as needed for nausea or vomiting. 07/05/19   Brittie Whisnant, Delorise Royals, PA-C    Allergies Patient has no known  allergies.  No family history on file.  Social History Social History   Tobacco Use  . Smoking status: Current Every Day Smoker    Packs/day: 1.00    Types: Cigarettes  . Smokeless tobacco: Never Used  Substance Use Topics  . Alcohol use: No  . Drug use: No     Review of Systems  Constitutional: No fever/chills Eyes: No visual changes. No discharge ENT: No upper respiratory complaints. Cardiovascular: no chest pain. Respiratory: no cough. No SOB. Gastrointestinal: Epigastric abdominal pain.  Positive for nausea, vomiting, diarrhea.  No constipation. Genitourinary: Negative for dysuria. No hematuria Musculoskeletal: Negative for musculoskeletal pain. Skin: Negative for rash, abrasions, lacerations, ecchymosis. Neurological: Negative for headaches, focal weakness or numbness. 10-point ROS otherwise negative.  ____________________________________________   PHYSICAL EXAM:  VITAL SIGNS: ED Triage Vitals  Enc Vitals Group     BP 07/05/19 1200 123/82     Pulse Rate 07/05/19 1200 61     Resp 07/05/19 1200 18     Temp 07/05/19 1200 98.4 F (36.9 C)     Temp Source 07/05/19 1200 Oral     SpO2 07/05/19 1200 99 %     Weight 07/05/19 1201 150 lb (68 kg)     Height 07/05/19 1201 5\' 11"  (1.803 m)     Head Circumference --      Peak Flow --      Pain Score 07/05/19 1201 10     Pain Loc --      Pain Edu? --      Excl. in GC? --  Constitutional: Alert and oriented. Well appearing and in no acute distress. Eyes: Conjunctivae are normal. PERRL. EOMI. Head: Atraumatic. ENT:      Ears:       Nose: No congestion/rhinnorhea.      Mouth/Throat: Mucous membranes are moist.  Neck: No stridor.   Hematological/Lymphatic/Immunilogical: No cervical lymphadenopathy. Cardiovascular: Normal rate, regular rhythm. Normal S1 and S2.  Good peripheral circulation. Respiratory: Normal respiratory effort without tachypnea or retractions. Lungs CTAB. Good air entry to the bases with no  decreased or absent breath sounds. Gastrointestinal: Bowel sounds 4 quadrants.  Soft to palpation all quadrants.  Mildly diffusely tender to palpation along the epigastric region.  No tenderness to palpation in the lower quadrants.  No guarding or rigidity. No palpable masses. No distention. No CVA tenderness. Musculoskeletal: Full range of motion to all extremities. No gross deformities appreciated. Neurologic:  Normal speech and language. No gross focal neurologic deficits are appreciated.  Skin:  Skin is warm, dry and intact. No rash noted. Psychiatric: Mood and affect are normal. Speech and behavior are normal. Patient exhibits appropriate insight and judgement.   ____________________________________________   LABS (all labs ordered are listed, but only abnormal results are displayed)  Labs Reviewed  COMPREHENSIVE METABOLIC PANEL - Abnormal; Notable for the following components:      Result Value   Total Protein 8.2 (*)    All other components within normal limits  CBC - Abnormal; Notable for the following components:   WBC 14.4 (*)    Hemoglobin 17.2 (*)    HCT 52.1 (*)    All other components within normal limits  URINALYSIS, COMPLETE (UACMP) WITH MICROSCOPIC - Abnormal; Notable for the following components:   Color, Urine YELLOW (*)    APPearance CLEAR (*)    Specific Gravity, Urine 1.040 (*)    Ketones, ur 20 (*)    All other components within normal limits  SARS CORONAVIRUS 2 BY RT PCR (HOSPITAL ORDER, PERFORMED IN [facility])  LIPASE, BLOOD   ____________________________________________  EKG   ____________________________________________  RADIOLOGY I personally viewed and evaluated these images as part of my medical decision making, as well as reviewing the written report by the radiologist.   EXAM: CHEST 1 VIEW  COMPARISON: 03/03/2010.  FINDINGS: The heart size and mediastinal contours are within normal limits. Both lungs are clear. The  visualized skeletal structures are unremarkable.  IMPRESSION: No active disease.  No results found.  ____________________________________________    PROCEDURES  Procedure(s) performed:    Procedures    Medications  sodium chloride flush (NS) 0.9 % injection 3 mL (has no administration in time range)  ondansetron (ZOFRAN) injection 4 mg (4 mg Intravenous Given 07/05/19 1219)  0.9 %  sodium chloride infusion ( Intravenous Stopped 07/05/19 1418)  iohexol (OMNIPAQUE) 300 MG/ML solution 100 mL (100 mLs Intravenous Contrast Given 07/05/19 1413)  sodium chloride 0.9 % bolus 1,000 mL (1,000 mLs Intravenous New Bag/Given 07/05/19 1554)  ondansetron (ZOFRAN) injection 4 mg (4 mg Intravenous Given 07/05/19 1554)     ____________________________________________   INITIAL IMPRESSION / ASSESSMENT AND PLAN / ED COURSE  Pertinent labs & imaging results that were available during my care of the patient were reviewed by me and considered in my medical decision making (see chart for details).  Review of the [place] CSRS was performed in accordance of the [facility] prior to dispensing any controlled drugs.           Patient's diagnosis is consistent with food poisoning.  Patient presented to emergency department with nausea, vomiting, diarrhea.  Patient states that he ate chili and a hamburger late last night, went to bed and woke with the symptoms.  He had epigastric pain with tenderness in this region.  Labs, imaging is reassuring.  Findings are consistent with food poisoning.  Patient will be prescribed Zofran for symptom relief.  Continue plenty of fluids at home.  Follow-up primary care as needed. Patient is given ED precautions to return to the ED for any worsening or new symptoms.     ____________________________________________  FINAL CLINICAL IMPRESSION(S) / ED DIAGNOSES  Final diagnoses:  Food poisoning  Intractable vomiting with nausea, unspecified vomiting type      NEW  MEDICATIONS STARTED DURING THIS VISIT:  ED Discharge Orders         Ordered    ondansetron (ZOFRAN-ODT) 4 MG disintegrating tablet  Every 8 hours PRN     07/05/19 1639              This chart was dictated using voice recognition software/Dragon. Despite best efforts to proofread, errors can occur which can change the meaning. Any change was purely unintentional.    Darletta Moll, PA-C 07/05/19 1640    Merlyn Lot, MD 07/10/19 [phone]

## 2019-07-05 NOTE — ED Notes (Signed)
First Nurse Note: Pt to ED via POV c/o N/V since last night after eating chili from cookout. Pt girlfriend states that pt has hx/o stomach ulcers. Pt is actively vomiting.

## 2019-07-05 NOTE — ED Triage Notes (Signed)
Pt states that he started vomiting this am, with nausea, some diarrhea, states he thinks it may be from what he ate around midnight, pt states he laid down after eating.

## 2020-06-16 ENCOUNTER — Emergency Department
Admission: EM | Admit: 2020-06-16 | Discharge: 2020-06-16 | Disposition: A | Discharge status: Home / Self Care | Payer: Self-pay | Attending: Emergency Medicine | Admitting: Emergency Medicine

## 2020-06-16 ENCOUNTER — Other Ambulatory Visit: Payer: Self-pay

## 2020-06-16 DIAGNOSIS — W57XXXA Bitten or stung by nonvenomous insect and other nonvenomous arthropods, initial encounter: Secondary | ICD-10-CM

## 2020-06-16 DIAGNOSIS — L089 Local infection of the skin and subcutaneous tissue, unspecified: Secondary | ICD-10-CM

## 2020-06-16 DIAGNOSIS — F17210 Nicotine dependence, cigarettes, uncomplicated: Secondary | ICD-10-CM | POA: Insufficient documentation

## 2020-06-16 DIAGNOSIS — S30863A Insect bite (nonvenomous) of scrotum and testes, initial encounter: Secondary | ICD-10-CM | POA: Insufficient documentation

## 2020-06-16 DIAGNOSIS — S30860A Insect bite (nonvenomous) of lower back and pelvis, initial encounter: Secondary | ICD-10-CM | POA: Insufficient documentation

## 2020-06-16 MED ORDER — DEXAMETHASONE SODIUM PHOSPHATE 10 MG/ML IJ SOLN
10.0000 mg | Freq: Once | INTRAMUSCULAR | Status: AC
  Administered 2020-06-16: 10 mg via INTRAMUSCULAR
  Filled 2020-06-16: qty 1

## 2020-06-16 MED ORDER — TRIAMCINOLONE ACETONIDE 0.1 % EX CREA: 1.0000 "application " | TOPICAL_CREAM | Freq: Two times a day (BID) | CUTANEOUS | 0 refills | Status: AC

## 2020-06-16 MED ORDER — HYDROXYZINE HCL 25 MG PO TABS
25.0000 mg | ORAL_TABLET | Freq: Four times a day (QID) | ORAL | 0 refills | Status: AC | PRN
Start: 2020-06-16 — End: ?

## 2020-06-16 MED ORDER — CEPHALEXIN 500 MG PO CAPS: 500.0000 mg | ORAL_CAPSULE | Freq: Three times a day (TID) | ORAL | 0 refills | Status: AC

## 2020-06-16 NOTE — Discharge Instructions (Signed)
Follow-up with your primary care provider or an urgent care if any continued problems.  An injection of Decadron was given to you while in the ED which should help with itching.  Begin taking Atarax 1 every 6 hours as needed for itching, Keflex for skin infection and Kenalog cream to apply to the area.

## 2020-06-16 NOTE — ED Triage Notes (Signed)
POV c/o bug bite to left flank and lower back area. States he was bitten by red ants X 2 days ago. Pt alert and oriented X4, cooperative, RR even and unlabored, color WNL. Pt in NAD.

## 2020-06-16 NOTE — ED Provider Notes (Signed)
[facility] Emergency Department Provider Note   ____________________________________________   Event Date/Time   First MD Initiated Contact with Patient 06/16/20 [phone]     (approximate)  I have reviewed the triage vital signs and the nursing notes.   HISTORY  Chief Complaint Insect Bite   HPI Casey Cefalu. is a 35 y.o. male resents to the ED after being bit by red ants.  Patient states that he had this problem before where he was bitten by red ant on the scrotal area and it became very red and swollen.  He states the rash he has currently is extremely pruritic.  He has tried over-the-counter Benadryl 25 mg twice daily without any results.  Denies any fever or chills.         History reviewed. No pertinent past medical history.  There are no problems to display for this patient.   History reviewed. No pertinent surgical history.  Prior to Admission medications   Medication Sig Start Date End Date Taking? Authorizing Provider  cephALEXin (KEFLEX) 500 MG capsule Take 1 capsule (500 mg total) by mouth 3 (three) times daily. 06/16/20  Yes Bridget Hartshorn L, PA-C  triamcinolone cream (KENALOG) 0.1 % Apply 1 application topically 2 (two) times daily. 06/16/20  Yes Bridget Hartshorn L, PA-C  hydrOXYzine (ATARAX/VISTARIL) 25 MG tablet Take 1 tablet (25 mg total) by mouth every 6 (six) hours as needed for itching. 06/16/20   Tommi Rumps, PA-C  ondansetron (ZOFRAN-ODT) 4 MG disintegrating tablet Take 1 tablet (4 mg total) by mouth every 8 (eight) hours as needed for nausea or vomiting. 07/05/19   Cuthriell, Delorise Royals, PA-C    Allergies Patient has no known allergies.  History reviewed. No pertinent family history.  Social History Social History   Tobacco Use  . Smoking status: Current Every Day Smoker    Packs/day: 1.00    Types: Cigarettes  . Smokeless tobacco: Never Used  Substance Use Topics  . Alcohol use: No  . Drug use: No    Review  of Systems Constitutional: No fever/chills Eyes: No visual changes. ENT: No sore throat. Cardiovascular: Denies chest pain. Respiratory: Denies shortness of breath. Gastrointestinal: No abdominal pain.  No nausea, no vomiting.  Genitourinary: Negative for dysuria. Musculoskeletal: Negative for back pain. Skin: Pruritus and erythema. Neurological: Negative for headaches, focal weakness or numbness.  ____________________________________________   PHYSICAL EXAM:  VITAL SIGNS: ED Triage Vitals [06/16/20 0958]  Enc Vitals Group     BP 120/83     Pulse Rate 79     Resp 16     Temp 98.9 F (37.2 C)     Temp Source Oral     SpO2 97 %     Weight 150 lb (68 kg)     Height 5\' 11"  (1.803 m)     Head Circumference      Peak Flow      Pain Score 0     Pain Loc      Pain Edu?      Excl. in GC?     Constitutional: Alert and oriented. Well appearing and in no acute distress. Eyes: Conjunctivae are normal.  Head: Atraumatic. Nose: No congestion/rhinnorhea. Neck: No stridor.   Cardiovascular: Normal rate, regular rhythm. Grossly normal heart sounds.  Good peripheral circulation. Respiratory: Normal respiratory effort.  No retractions. Lungs CTAB. Gastrointestinal: Soft and nontender. No distention.  Musculoskeletal: Moves upper and lower extremities without any difficulty.  Normal gait was noted. Neurologic:  Normal speech and language. No gross focal neurologic deficits are appreciated. No gait instability. Skin:  Skin is warm, dry and intact.  Left flank area there is isolated erythematous areas with papules suggestive of a insect bite.  Between the buttocks superiorly there is also erythema without tenderness to palpation.  No drainage is present.  There is no area that appears to be an insect bite however patient states that this is extremely itchy and same characteristics as his red ant bites in the past. Psychiatric: Mood and affect are normal. Speech and behavior are  normal.  ____________________________________________   LABS (all labs ordered are listed, but only abnormal results are displayed)  Labs Reviewed - No data to display ____________________________________________   PROCEDURES  Procedure(s) performed (including Critical Care):  Procedures   ____________________________________________   INITIAL IMPRESSION / ASSESSMENT AND PLAN / ED COURSE  As part of my medical decision making, I reviewed the following data within the electronic MEDICAL RECORD NUMBER Notes from prior ED visits and [place] Controlled Substance Database  35 year old male presents to the ED with complaint of red ant bites 2 days ago which is now left him with a rash to his left flank and upper buttocks area.  Patient has been taking Benadryl without any relief of his itching.  Patient was given Decadron 10 mg IM while in the ED.  A prescription for Atarax 25 mg every 6 hours, Keflex 500 mg 3 times daily and triamcinolone cream to apply to area twice daily as needed for itching.  He is to follow-up with his PCP if any continued problems or return to the emergency department if any severe worsening of his skin condition.  ____________________________________________   FINAL CLINICAL IMPRESSION(S) / ED DIAGNOSES  Final diagnoses:  Insect bite, unspecified site, initial encounter  Skin infection     ED Discharge Orders         Ordered    hydrOXYzine (ATARAX/VISTARIL) 25 MG tablet  Every 6 hours PRN        06/16/20 1034    cephALEXin (KEFLEX) 500 MG capsule  3 times daily        06/16/20 1034    triamcinolone cream (KENALOG) 0.1 %  2 times daily        06/16/20 1034          *Please note:  Casey Easom. was evaluated in Emergency Department on 06/16/2020 for the symptoms described in the history of present illness. He was evaluated in the context of the global COVID-19 pandemic, which necessitated consideration that the patient might be at risk for infection with  the SARS-CoV-2 virus that causes COVID-19. Institutional protocols and algorithms that pertain to the evaluation of patients at risk for COVID-19 are in a state of rapid change based on information released by regulatory bodies including the CDC and federal and state organizations. These policies and algorithms were followed during the patient's care in the ED.  Some ED evaluations and interventions may be delayed as a result of limited staffing during and the pandemic.*   Note:  This document was prepared using Dragon voice recognition software and may include unintentional dictation errors.    Tommi Rumps, PA-C 06/16/20 1414    Sharman Cheek, MD 06/18/20 0040
